# Patient Record
Sex: Female | Born: 2000
Health system: Southern US, Community
[De-identification: ages and names within clinical notes are randomized; demographics above are authoritative.]

## PROBLEM LIST (undated history)

## (undated) ENCOUNTER — Ambulatory Visit: Discharge status: Absent without leave | Payer: Self-pay

---
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
## 2020-01-01 ENCOUNTER — Other Ambulatory Visit: Payer: Self-pay

## 2020-01-01 ENCOUNTER — Ambulatory Visit (INDEPENDENT_AMBULATORY_CARE_PROVIDER_SITE_OTHER): Payer: BC Managed Care – PPO | Admitting: Nurse Practitioner

## 2020-01-01 ENCOUNTER — Encounter: Payer: Self-pay | Admitting: Nurse Practitioner

## 2020-01-01 VITALS — BP 118/72 | HR 78 | Ht 64.5 in | Wt 159.4 lb

## 2020-01-01 DIAGNOSIS — Z01419 Encounter for gynecological examination (general) (routine) without abnormal findings: Secondary | ICD-10-CM | POA: Diagnosis not present

## 2020-01-01 MED ORDER — DROSPIRENONE-ETHINYL ESTRADIOL 3-0.02 MG PO TABS: 1.0000 | ORAL_TABLET | Freq: Every day | ORAL | 0 refills | Status: DC

## 2020-01-01 NOTE — Progress Notes (Signed)
Subjective:    Patient ID: Laura Mccoy, female    DOB: 2000-07-01, 19 y.o.   MRN: [id number]  HPI  The patient comes in today for a wellness visit.    A review of their health history was completed.  A review of medications was also completed.  Any needed refills; birth control  Eating habits: not as good during the summer; better when she is back in school  Falls/  MVA accidents in past few months: none  Regular exercise: softball training  Specialist pt sees on regular basis: none  Preventative health issues were discussed.   Additional concerns: none  Menses regular, normal flow on oc's. Denies any history of sexual activity.  Regular vision and dental exams.   Review of Systems  Constitutional: Negative for activity change, appetite change, fatigue and fever.  Respiratory: Negative for cough, chest tightness, shortness of breath and wheezing.   Cardiovascular: Negative for chest pain.  Gastrointestinal: Negative for abdominal distention, abdominal pain, constipation, diarrhea, nausea and vomiting.  Genitourinary: Negative for difficulty urinating, dysuria, enuresis, frequency, genital sores, menstrual problem, pelvic pain, urgency and vaginal discharge.   Depression screen PHQ 2/9 01/01/2020  Decreased Interest 0  Down, Depressed, Hopeless 0  PHQ - 2 Score 0        Objective:   Physical Exam Constitutional:      General: She is not in acute distress.    Appearance: Normal appearance. She is well-developed.  Neck:     Thyroid: No thyromegaly.     Trachea: No tracheal deviation.  Cardiovascular:     Rate and Rhythm: Normal rate and regular rhythm.     Heart sounds: Normal heart sounds. No murmur heard.   Pulmonary:     Effort: Pulmonary effort is normal.     Breath sounds: Normal breath sounds.  Abdominal:     General: There is no distension.     Palpations: Abdomen is soft. There is no mass.     Tenderness: There is no abdominal tenderness.    Genitourinary:    Vagina: Normal.     Comments: Defers GU and breast exams. Denies any problems.  Musculoskeletal:        General: Normal range of motion.     Cervical back: Normal range of motion and neck supple.     Comments: Ortho exam normal. Scoliosis exam normal.   Lymphadenopathy:     Cervical: No cervical adenopathy.  Skin:    General: Skin is warm and dry.     Findings: No rash.  Neurological:     Mental Status: She is alert and oriented to person, place, and time.     Coordination: Coordination normal.     Gait: Gait normal.     Deep Tendon Reflexes: Reflexes normal.  Psychiatric:        Mood and Affect: Mood normal.        Behavior: Behavior normal.        Thought Content: Thought content normal.        Judgment: Judgment normal.    Today's Vitals   01/01/20 1325  BP: 118/72  Pulse: 78  Weight: 159 lb 6.4 oz (72.3 kg)  Height: 5' 4.5" (1.638 m)   Body mass index is 26.94 kg/m.        Assessment & Plan:  Well woman exam  Meds ordered this encounter  Medications  . drospirenone-ethinyl estradiol (YAZ) 3-0.02 MG tablet    Sig: Take 1 tablet by mouth daily.  Dispense:  30 tablet    Refill:  0    Order Specific Question:   Supervising Provider    Answer:   Lilyan Punt A [[id number]]   Continue oc's as directed.  Discussed safe sex issues. Encouraged healthy diet.  Return in about 1 year (around 12/31/2020) for physical.

## 2020-01-01 NOTE — Patient Instructions (Signed)
Tdap vaccine

## 2020-01-02 ENCOUNTER — Encounter: Payer: Self-pay | Admitting: Nurse Practitioner

## 2020-02-26 ENCOUNTER — Telehealth: Payer: Self-pay | Admitting: *Deleted

## 2020-02-26 ENCOUNTER — Other Ambulatory Visit: Payer: Self-pay

## 2020-02-26 ENCOUNTER — Telehealth (INDEPENDENT_AMBULATORY_CARE_PROVIDER_SITE_OTHER): Payer: BC Managed Care – PPO | Admitting: Family Medicine

## 2020-02-26 DIAGNOSIS — J019 Acute sinusitis, unspecified: Secondary | ICD-10-CM

## 2020-02-26 DIAGNOSIS — R059 Cough, unspecified: Secondary | ICD-10-CM

## 2020-02-26 MED ORDER — PREDNISONE 20 MG PO TABS: ORAL_TABLET | ORAL | 0 refills | Status: DC

## 2020-02-26 MED ORDER — AZITHROMYCIN 250 MG PO TABS: ORAL_TABLET | ORAL | 0 refills | Status: DC

## 2020-02-26 MED ORDER — BENZONATATE 100 MG PO CAPS
100.0000 mg | ORAL_CAPSULE | Freq: Three times a day (TID) | ORAL | 1 refills | Status: DC | PRN
Start: 2020-02-26 — End: 2020-05-19

## 2020-02-26 NOTE — Telephone Encounter (Signed)
Ms. aracelia, brinson are scheduled for a virtual visit with your provider today.    Just as we do with appointments in the office, we must obtain your consent to participate.  Your consent will be active for this visit and any virtual visit you may have with one of our providers in the next 365 days.    If you have a MyChart account, I can also send a copy of this consent to you electronically.  All virtual visits are billed to your insurance company just like a traditional visit in the office.  As this is a virtual visit, video technology does not allow for your provider to perform a traditional examination.  This may limit your provider's ability to fully assess your condition.  If your provider identifies any concerns that need to be evaluated in person or the need to arrange testing such as labs, EKG, etc, we will make arrangements to do so.    Although advances in technology are sophisticated, we cannot ensure that it will always work on either your end or our end.  If the connection with a video visit is poor, we may have to switch to a telephone visit.  With either a video or telephone visit, we are not always able to ensure that we have a secure connection.   I need to obtain your verbal consent now.   Are you willing to proceed with your visit today?   Zarina B Krall has provided verbal consent on 02/26/2020 for a virtual visit (video or telephone).   Kathleen Lime, RN 02/26/2020  8:25 AM

## 2020-02-26 NOTE — Progress Notes (Signed)
° °  Subjective:    Patient ID: Laura Mccoy, female    DOB: [birth year], 19 y.o.   MRN: [id number]  Cough This is a new problem. The current episode started in the past 7 days.  Patient has had 10 days of illness. Started off like allergies. Then moved into her chest. A lot of congestion and coughing. Patient has not had any wheezing. But does feel little bit short of breath when she goes up steps. She denies any major setbacks. No nausea vomiting or diarrhea. Energy level is subpar.  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   Virtual Visit via Video Note  I connected with Laura Mccoy on 02/26/20 at  8:20 AM EDT by a video enabled telemedicine application and verified that I am speaking with the correct person using two identifiers.  Location: Patient: home Provider: office   I discussed the limitations of evaluation and management by telemedicine and the availability of in person appointments. The patient expressed understanding and agreed to proceed.  History of Present Illness:    Observations/Objective:   Assessment and Plan:   Follow Up Instructions:    I discussed the assessment and treatment plan with the patient. The patient was provided an opportunity to ask questions and all were answered. The patient agreed with the plan and demonstrated an understanding of the instructions.   The patient was advised to call back or seek an in-person evaluation if the symptoms worsen or if the condition fails to improve as anticipated. Patient was seen in person    Objective:   Physical Exam Neck no masses lungs respiratory rate normal heart regular but frequent cough noted negative sinus tenderness         Assessment & Plan:  Patient tested frequently for Covid at her school so far negative We will collect Covid test today More than likely viral issue with secondary rhinosinusitis versus bronchitis Tessalon as needed Short course prednisone Z-Pak as directed Follow-up if progressive troubles

## 2020-02-28 LAB — NOVEL CORONAVIRUS, NAA: SARS-CoV-2, NAA: NOT DETECTED

## 2020-02-28 LAB — SARS-COV-2, NAA 2 DAY TAT

## 2020-02-28 LAB — SPECIMEN STATUS REPORT

## 2020-04-28 ENCOUNTER — Other Ambulatory Visit: Payer: Self-pay

## 2020-04-28 ENCOUNTER — Ambulatory Visit
Admission: RE | Admit: 2020-04-28 | Discharge: 2020-04-28 | Disposition: A | Discharge status: Home / Self Care | Payer: BC Managed Care – PPO | Source: Ambulatory Visit | Attending: Emergency Medicine | Admitting: Emergency Medicine

## 2020-04-28 VITALS — BP 133/85 | HR 75 | Temp 98.5°F | Resp 19

## 2020-04-28 DIAGNOSIS — N390 Urinary tract infection, site not specified: Secondary | ICD-10-CM | POA: Insufficient documentation

## 2020-04-28 DIAGNOSIS — R300 Dysuria: Secondary | ICD-10-CM | POA: Insufficient documentation

## 2020-04-28 LAB — POCT URINALYSIS DIP (MANUAL ENTRY)
Bilirubin, UA: NEGATIVE
Glucose, UA: NEGATIVE mg/dL
Ketones, POC UA: NEGATIVE mg/dL
Nitrite, UA: NEGATIVE
Protein Ur, POC: NEGATIVE mg/dL
Spec Grav, UA: 1.02 (ref 1.010–1.025)
Urobilinogen, UA: 0.2 E.U./dL
pH, UA: 7 (ref 5.0–8.0)

## 2020-04-28 LAB — POCT URINE PREGNANCY: Preg Test, Ur: NEGATIVE

## 2020-04-28 MED ORDER — NITROFURANTOIN MONOHYD MACRO 100 MG PO CAPS: 100.0000 mg | ORAL_CAPSULE | Freq: Two times a day (BID) | ORAL | 0 refills | Status: DC

## 2020-04-28 NOTE — ED Triage Notes (Signed)
Pt presents with complaints of dysuria x 1 week. Reports improvement in symptoms when drinking extra water. Symptoms have been persistent.

## 2020-04-28 NOTE — Discharge Instructions (Addendum)
Urine culture sent.  We will call you with the results.   Push fluids and get plenty of rest.   Take antibiotic as directed and to completion Follow up with PCP if symptoms persists Return here or go to ER if you have any new or worsening symptoms such as fever, worsening abdominal pain, nausea/vomiting, flank pain, etc... 

## 2020-04-28 NOTE — ED Provider Notes (Addendum)
[facility]   Chief Complaint  Patient presents with  . Appointment    2     SUBJECTIVE:  Laura Mccoy is a 19 y.o. female who presented to the urgent care for complaint of dysuria for the past 1 week.  Patient denies a precipitating event, recent sexual encounter, excessive caffeine intake.  Has tried OTC medications without relief.  Symptoms are made worse with urination.  Admits to similar symptoms in the past.  Denies fever, chills, nausea, vomiting, abdominal pain, flank pain, abnormal vaginal discharge or bleeding, hematuria.    LMP: No LMP recorded. (Menstrual status: Other).  ROS: As in HPI.  All other pertinent ROS negative.     No past medical history on file. No past surgical history on file. Allergies  Allergen Reactions  . Penicillins     REACTION: rash   No current facility-administered medications on file prior to encounter.   Current Outpatient Medications on File Prior to Encounter  Medication Sig Dispense Refill  . azithromycin (ZITHROMAX Z-PAK) 250 MG tablet Take 2 tablets (500 mg) on  Day 1,  followed by 1 tablet (250 mg) once daily on Days 2 through 5. 6 each 0  . benzonatate (TESSALON PERLES) 100 MG capsule Take 1 capsule (100 mg total) by mouth 3 (three) times daily as needed for cough. 30 capsule 1  . drospirenone-ethinyl estradiol (YAZ) 3-0.02 MG tablet Take 1 tablet by mouth daily. 30 tablet 0  . predniSONE (DELTASONE) 20 MG tablet 2qd for 5d 10 tablet 0   Social History   Socioeconomic History  . Marital status: Single    Spouse name: Not on file  . Number of children: Not on file  . Years of education: Not on file  . Highest education level: Not on file  Occupational History  . Not on file  Tobacco Use  . Smoking status: Never Smoker  . Smokeless tobacco: Never Used  Vaping Use  . Vaping Use: Never used  Substance and Sexual Activity  . Alcohol use: No  . Drug use: No  . Sexual activity: Never    Birth  control/protection: Pill  Other Topics Concern  . Not on file  Social History Narrative  . Not on file   Social Determinants of Health   Financial Resource Strain: Not on file  Food Insecurity: Not on file  Transportation Needs: Not on file  Physical Activity: Not on file  Stress: Not on file  Social Connections: Not on file  Intimate Partner Violence: Not on file   Family History  Problem Relation Age of Onset  . Heart disease Other   . Cancer Other   . Diabetes Other   . Healthy Mother   . Healthy Father     OBJECTIVE:  Vitals:   04/28/20 1453  BP: 133/85  Pulse: 75  Resp: 19  Temp: 98.5 F (36.9 C)  SpO2: 96%   General appearance: AOx3 in no acute distress HEENT: NCAT.  Oropharynx clear.  Lungs: clear to auscultation bilaterally without adventitious breath sounds Heart: regular rate and rhythm.  Radial pulses 2+ symmetrical bilaterally Abdomen: soft; non-distended; no tenderness; bowel sounds present; no guarding or rebound tenderness Back: no CVA tenderness Extremities: no edema; symmetrical with no gross deformities Skin: warm and dry Neurologic: Ambulates from chair to exam table without difficulty Psychological: alert and cooperative; normal mood and affect  Labs Reviewed  POCT URINALYSIS DIP (MANUAL ENTRY) - Abnormal; Notable for the following components:  Result Value   Blood, UA moderate (*)    Leukocytes, UA Moderate (2+) (*)    All other components within normal limits  URINE CULTURE  POCT URINE PREGNANCY    ASSESSMENT & PLAN:  1. Dysuria   2. Acute lower UTI     Meds ordered this encounter  Medications  . DISCONTD: nitrofurantoin, macrocrystal-monohydrate, (MACROBID) 100 MG capsule    Sig: Take 1 capsule (100 mg total) by mouth 2 (two) times daily.    Dispense:  10 capsule    Refill:  0  . nitrofurantoin, macrocrystal-monohydrate, (MACROBID) 100 MG capsule    Sig: Take 1 capsule (100 mg total) by mouth 2 (two) times daily.     Dispense:  10 capsule    Refill:  0    Discharge instructions  Urine culture sent.  We will call you with the results.   Push fluids and get plenty of rest.   Take antibiotic as directed and to completion Follow up with PCP if symptoms persists Return here or go to ER if you have any new or worsening symptoms such as fever, worsening abdominal pain, nausea/vomiting, flank pain, etc.   Outlined signs and symptoms indicating need for more acute intervention. Patient verbalized understanding. After Visit Summary given.     Durward Parcel, FNP 04/28/20 1506    Durward Parcel, FNP 04/28/20 1715

## 2020-04-30 LAB — URINE CULTURE: Culture: NO GROWTH

## 2020-05-19 ENCOUNTER — Ambulatory Visit: Payer: BC Managed Care – PPO | Admitting: Nurse Practitioner

## 2020-05-19 ENCOUNTER — Encounter: Payer: Self-pay | Admitting: Nurse Practitioner

## 2020-05-19 ENCOUNTER — Other Ambulatory Visit: Payer: Self-pay

## 2020-05-19 VITALS — BP 122/80 | HR 108 | Temp 97.8°F | Wt 161.4 lb

## 2020-05-19 DIAGNOSIS — Z23 Encounter for immunization: Secondary | ICD-10-CM

## 2020-05-19 DIAGNOSIS — Z7189 Other specified counseling: Secondary | ICD-10-CM | POA: Diagnosis not present

## 2020-05-19 DIAGNOSIS — Z7185 Encounter for immunization safety counseling: Secondary | ICD-10-CM

## 2020-05-19 NOTE — Progress Notes (Signed)
   Subjective:    Patient ID: Laura Mccoy, female    DOB: [birth year], 19 y.o.   MRN: [id number]  HPI Patient comes in for tdap booster as recommended by Eber Jones.  No injury, just needs routine update of Tdap. Patient is sexually active.  Defers STD testing.  Has not had her HPV vaccine. Wants to discuss Covid vaccine.  Review of Systems     Objective:   Physical Exam NAD.  Alert, oriented.  Lungs clear.  Heart regular rate rhythm. Today's Vitals   05/19/20 1028  BP: 122/80  Pulse: (!) 108  Temp: 97.8 F (36.6 C)  SpO2: 99%  Weight: 161 lb 6.4 oz (73.2 kg)   Body mass index is 27.28 kg/m.       Assessment & Plan:  HPV vaccine counseling  Need for vaccination - Plan: Td : Tetanus/diphtheria >7yo Preservative  free  Tdap administered today. Given written and verbal information on HPV vaccine. Defers STD testing at this time, denies any problems. Also discussion regarding Covid vaccine.  Patient plans to get this in the near future.

## 2020-05-19 NOTE — Patient Instructions (Signed)
HPV Vaccine Information for Parents  HPV (human papillomavirus) is a common virus that spreads from person to person through sexual contact. It can spread during vaginal, anal, or oral sex. There are many types of HPV viruses, and some may cause cancer. Your child can get a vaccination to prevent HPV infection and cancer. The vaccine is both safe and effective. It is recommended for boys and girls at about 53-19 years of age. Getting the vaccination at this age--before becoming sexually active--gives your child the best chance at protection from HPV infection through adulthood. How can HPV affect my child? HPV infection can cause:  Genital warts.  Mouth or throat cancer (oropharyngeal cancer).  Anal cancer.  Cervical, vulvar, or vaginal cancer.  Penile cancer. During pregnancy, HPV infection can be passed to the baby. This infection can cause warts to develop in a baby's throat and windpipe. What actions can I take to lower my child's risk for HPV? To lower your child's risk for HPV infection, have him or her get the HPV vaccination before becoming sexually active. The best time for vaccination is between ages 19 and 15, though it can be given to children as young as 19 years old. If your child gets the first dose before age 19, the vaccination can be given as 2 shots (doses), 6-12 months apart. In some situations, 3 doses are needed:  If your child starts the vaccine before age 19 but does not have a second dose within 6-12 months, your child will need 3 doses to complete the vaccination. When your child has the first dose, it is important to make an appointment for the next shot and keep the appointment.  Teens who are not vaccinated before age 19 will need 3 doses given within 6 months.  If your child has a weak immune system, he or she may need 3 doses. Young adults can also get the vaccination, even if they are already sexually active and even if they have already been infected with HPV.  The vaccination can still help prevent the types of cancer-causing HPV that a person has not been infected with. What are the risks and benefits of the HPV vaccine? Benefits The main benefit of getting vaccinated is to prevent certain cancers, including:  Cervical, vulvar, and vaginal cancer in females.  Penile cancer in males.  Oral and anal cancer in both males and females. The risk of these cancers is lower if your child gets vaccinated before he or she becomes sexually active. The vaccine also prevents genital warts caused by HPV. Risks The risks, although low, include side effects or reactions to the vaccine. Very few reactions have been reported, but they can include:  Soreness, redness, or swelling at the injection site.  Dizziness or headache.  Fever. Who should not get the HPV vaccine or should wait to get it? Some children should not get the HPV vaccine or should wait. Discuss the risks and benefits of the vaccine with your child's health care provider if your child:  Has had a severe allergic reaction to other vaccinations.  Is allergic to yeast.  Has a fever.  Has had a recent illness.  Is pregnant or may be pregnant. Where to find more information  Centers for Disease Control and Prevention: https://www.boyd-meyer.org/  American Academy of Pediatrics: healthychildren.org Summary  HPV (human papillomavirus) is a common virus that spreads from person to person through sexual contact. It can spread during vaginal, anal, or oral sex.  Your child can  get a vaccination to prevent HPV infection and cancer. It is best to get the vaccination before becoming sexually active.  The HPV vaccine can protect your child from genital warts and certain types of cancer, including cancer of the cervix, throat, mouth, vulva, vagina, anus, and penis.  The HPV vaccine is both safe and effective.  The best time for boys and girls to get the vaccination is when they are between ages 19 and  57. This information is not intended to replace advice given to you by your health care provider. Make sure you discuss any questions you have with your health care provider. Document Revised: 10/27/2018 Document Reviewed: 07/25/2017 Elsevier Patient Education  2020 [employer].

## 2020-05-20 ENCOUNTER — Encounter: Payer: Self-pay | Admitting: Nurse Practitioner

## 2020-11-08 DIAGNOSIS — B360 Pityriasis versicolor: Secondary | ICD-10-CM | POA: Diagnosis not present

## 2020-11-08 DIAGNOSIS — L308 Other specified dermatitis: Secondary | ICD-10-CM | POA: Diagnosis not present

## 2020-11-25 ENCOUNTER — Ambulatory Visit (INDEPENDENT_AMBULATORY_CARE_PROVIDER_SITE_OTHER): Payer: BC Managed Care – PPO | Admitting: Nurse Practitioner

## 2020-11-25 ENCOUNTER — Encounter: Payer: Self-pay | Admitting: Nurse Practitioner

## 2020-11-25 ENCOUNTER — Other Ambulatory Visit: Payer: Self-pay

## 2020-11-25 VITALS — BP 119/81 | HR 80 | Temp 98.1°F | Ht 64.5 in | Wt 152.0 lb

## 2020-11-25 DIAGNOSIS — Z0000 Encounter for general adult medical examination without abnormal findings: Secondary | ICD-10-CM

## 2020-11-25 DIAGNOSIS — F419 Anxiety disorder, unspecified: Secondary | ICD-10-CM

## 2020-11-25 DIAGNOSIS — Z0001 Encounter for general adult medical examination with abnormal findings: Secondary | ICD-10-CM

## 2020-11-25 NOTE — Patient Instructions (Signed)
HPV vaccine; [email or web address]

## 2020-11-25 NOTE — Progress Notes (Signed)
Subjective:    Patient ID: Laura Mccoy, female    DOB: 2001-01-12, 20 y.o.   MRN: [id number]  HPI The patient comes in today for a wellness visit.    A review of their health history was completed.  A review of medications was also completed.  Any needed refills; update refills on yaz  reqesting one year of refills so she dont run out at school.   Eating habits: heatlh conscious  Falls/  MVA accidents in past few months: none  Regular exercise: run, walk, cardio, weights, swim  Specialist pt sees on regular basis: none  Preventative health issues were discussed.   Same sexual partner for 3 years; defers STD testing.  Regular vision and dental exams.  Needs sports physical form completed today. Regular cycles, normal flow lasting about 5 days. No missed oc's.  Has been having some anxiety. Denies suicidal or homicidal thoughts or ideation.  Depression screen [facility] 2/9 11/25/2020 01/01/2020  Decreased Interest 0 0  Down, Depressed, Hopeless 0 0  PHQ - 2 Score 0 0   GAD 7 : Generalized Anxiety Score 11/25/2020  Nervous, Anxious, on Edge 0  Control/stop worrying 2  Worry too much - different things 2  Trouble relaxing 1  Restless 1  Easily annoyed or irritable 2  Afraid - awful might happen 2  Total GAD 7 Score 10  Anxiety Difficulty Not difficult at all      Review of Systems  Constitutional:  Negative for activity change, appetite change and fatigue.  HENT:  Negative for sore throat.   Respiratory:  Negative for cough, chest tightness, shortness of breath and wheezing.   Cardiovascular:  Negative for chest pain.  Gastrointestinal:  Negative for abdominal distention, abdominal pain, constipation, diarrhea, nausea and vomiting.  Genitourinary:  Negative for difficulty urinating, dyspareunia, dysuria, enuresis, frequency, genital sores, menstrual problem, pelvic pain, urgency and vaginal discharge.  Reviewed sports physical history form, no issues noted.     Objective:   Physical Exam Constitutional:      General: She is not in acute distress.    Appearance: She is well-developed.  HENT:     Head: Normocephalic.     Right Ear: Tympanic membrane normal.     Left Ear: Tympanic membrane normal.     Mouth/Throat:     Mouth: Mucous membranes are moist.     Pharynx: Oropharynx is clear.  Eyes:     Comments: Regular optometry exams.  Neck:     Thyroid: No thyromegaly.     Trachea: No tracheal deviation.     Comments: Thyroid non tender to palpation. No mass or goiter noted.  Cardiovascular:     Rate and Rhythm: Normal rate and regular rhythm.     Heart sounds: Normal heart sounds. No murmur heard. Pulmonary:     Effort: Pulmonary effort is normal.     Breath sounds: Normal breath sounds.  Abdominal:     General: There is no distension.     Palpations: Abdomen is soft. There is no mass.     Tenderness: There is no abdominal tenderness.  Genitourinary:    Vagina: Normal.     Comments: Defers GU and breast exams, denies any problems. Musculoskeletal:        General: Normal range of motion.     Cervical back: Normal range of motion and neck supple.     Comments: Normal orthopedic exam.  Scoliosis exam normal.  Lymphadenopathy:     Cervical: No cervical  adenopathy.  Skin:    General: Skin is warm and dry.     Findings: No rash.  Neurological:     Mental Status: She is alert and oriented to person, place, and time.     Coordination: Coordination normal.     Gait: Gait normal.     Deep Tendon Reflexes: Reflexes normal.  Psychiatric:        Mood and Affect: Mood normal.        Behavior: Behavior normal.        Thought Content: Thought content normal.        Judgment: Judgment normal.  Today's Vitals   11/25/20 0958  BP: 119/81  Pulse: 80  Temp: 98.1 F (36.7 C)  SpO2: 99%  Weight: 152 lb (68.9 kg)  Height: 5' 4.5" (1.638 m)   Body mass index is 25.69 kg/m.         Assessment & Plan:   Problem List Items Addressed  This Visit       Other   Anxiety   Other Visit Diagnoses     Routine general medical examination at a health care facility    -  Primary      Defers medication or counseling at this time.  Will contact office if she needs further assistance. Encouraged continued activity and healthy diet. Discussed safe sex issues.  Defers STD testing at this time. Return in about 1 year (around 11/25/2021) for physical.

## 2020-11-26 ENCOUNTER — Encounter: Payer: Self-pay | Admitting: Nurse Practitioner

## 2020-11-26 DIAGNOSIS — F419 Anxiety disorder, unspecified: Secondary | ICD-10-CM | POA: Insufficient documentation

## 2020-12-07 ENCOUNTER — Encounter: Payer: Self-pay | Admitting: Nurse Practitioner

## 2020-12-07 ENCOUNTER — Other Ambulatory Visit: Payer: Self-pay | Admitting: Nurse Practitioner

## 2020-12-07 ENCOUNTER — Telehealth: Payer: Self-pay | Admitting: Nurse Practitioner

## 2020-12-07 MED ORDER — DROSPIRENONE-ETHINYL ESTRADIOL 3-0.02 MG PO TABS: 1.0000 | ORAL_TABLET | Freq: Every day | ORAL | 11 refills | Status: DC

## 2020-12-07 NOTE — Telephone Encounter (Signed)
Pt also sent my chart message; message has been routed to provider.

## 2020-12-07 NOTE — Telephone Encounter (Signed)
Patient is needing refill on birthcontrol sent in. She was seen 7/8 for physical and they were suppose to be sent in. She is needing drospirenone-ethinyl estradiol 3.0 called into [facility] . [place] beach on [place]

## 2021-01-06 ENCOUNTER — Encounter: Payer: BC Managed Care – PPO | Admitting: Nurse Practitioner

## 2021-05-23 ENCOUNTER — Other Ambulatory Visit: Payer: Self-pay

## 2021-05-23 ENCOUNTER — Encounter: Payer: Self-pay | Admitting: Podiatry

## 2021-05-23 ENCOUNTER — Ambulatory Visit: Payer: BC Managed Care – PPO | Admitting: Podiatry

## 2021-05-23 DIAGNOSIS — L603 Nail dystrophy: Secondary | ICD-10-CM

## 2021-05-23 NOTE — Progress Notes (Signed)
°  Subjective:  Patient ID: Laura Mccoy, female    DOB: 07-26-2000,  MRN: [id number] HPI Chief Complaint  Patient presents with   Nail Problem    Hallux bilateral - lateral borders of nail cracked and fell off, the portion that fell off was discolored, active in softball   New Patient (Initial Visit)    21 y.o. female presents with the above complaint.   ROS: Denies fever chills nausea vomiting muscle aches pains calf pain back pain chest pain shortness of breath.  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  No past medical history on file. No past surgical history on file.  Current Outpatient Medications:    drospirenone-ethinyl estradiol (YAZ) 3-0.02 MG tablet, Take 1 tablet by mouth daily., Disp: 28 tablet, Rfl: 11  Allergies  Allergen Reactions   Penicillins     REACTION: rash   Review of Systems Objective:  There were no vitals filed for this visit.  General: Well developed, nourished, in no acute distress, alert and oriented x3   Dermatological: Skin is warm, dry and supple bilateral. Nails x 10 are well maintained; remaining integument appears unremarkable at this time. There are no open sores, no preulcerative lesions, no rash or signs of infection present.  Hallux nails bilaterally demonstrate lateral corners of each hallux nail has some basically delaminated and split from the lower level of the digit.  There is nail on the bed but basically the delamination appears to be chronic.  There is no signs of infection no signs of fungus on the skin or any of the other nails.  Vascular: Dorsalis Pedis artery and Posterior Tibial artery pedal pulses are 2/4 bilateral with immedate capillary fill time. Pedal hair growth present. No varicosities and no lower extremity edema present bilateral.   Neruologic: Grossly intact via light touch bilateral. Vibratory intact via tuning fork bilateral. Protective  threshold with Semmes Wienstein monofilament intact to all pedal sites bilateral. Patellar and Achilles deep tendon reflexes 2+ bilateral. No Babinski or clonus noted bilateral.   Musculoskeletal: No gross boney pedal deformities bilateral. No pain, crepitus, or limitation noted with foot and ankle range of motion bilateral. Muscular strength 5/5 in all groups tested bilateral.  Gait: Unassisted, Nonantalgic.    Radiographs:  None taken  Assessment & Plan:   Assessment: Nail dystrophy hallux bilateral most likely secondary to nail trauma and sports activities as well as nail weakness.  Plan: Discussed with her today to start biotin.  I also recommended that she try to keep her cleats adjusted in such a manner that they do not rub her toes.     Dyan Labarbera T. [place], [place]

## 2021-09-01 ENCOUNTER — Encounter: Payer: Self-pay | Admitting: Nurse Practitioner

## 2021-09-01 MED ORDER — FLUCONAZOLE 150 MG PO TABS: ORAL_TABLET | ORAL | 0 refills | Status: DC

## 2021-09-29 ENCOUNTER — Encounter: Payer: Self-pay | Admitting: Nurse Practitioner

## 2021-09-29 ENCOUNTER — Ambulatory Visit: Payer: BC Managed Care – PPO | Admitting: Nurse Practitioner

## 2021-09-29 VITALS — BP 134/87 | Ht 64.5 in | Wt 158.6 lb

## 2021-09-29 DIAGNOSIS — Z3041 Encounter for surveillance of contraceptive pills: Secondary | ICD-10-CM

## 2021-09-29 MED ORDER — DROSPIRENONE-ETHINYL ESTRADIOL 3-0.02 MG PO TABS: 1.0000 | ORAL_TABLET | Freq: Every day | ORAL | 3 refills | Status: DC

## 2021-09-29 NOTE — Progress Notes (Signed)
? ?  Subjective:  ? ? Patient ID: Laura Mccoy, female    DOB: September 02, 2000, 21 y.o.   MRN: [id number] ? ?HPI ? ?Patient arrives for a follow up on birth control. Patient states she needs refills on birth control. ?Rarely misses a pill.  Same sexual partner.  Defers STD testing.  Cycles regular with normal flow.  Non-smoker.  No vaping.  Will be moving to [facility] with her boyfriend in the near future.   ?Activity is limited at this time due to a injury to her right ankle while playing softball.  Wearing a large brace today. ? ? ? ?   ?Objective:  ? Physical Exam ?NAD.  Alert, oriented.  Lungs clear.  Heart regular rate rhythm. ? ?Today's Vitals  ? 09/29/21 0935  ?BP: 134/87  ?Weight: 158 lb 9.6 oz (71.9 kg)  ?Height: 5' 4.5" (1.638 m)  ? ?Body mass index is 26.8 kg/m?. ? ? ? ? ?   ?Assessment & Plan:  ? ?Problem List Items Addressed This Visit   ?None ?Visit Diagnoses   ? ? Encounter for repeat prescription of oral contraceptives    -  Primary  ? ?  ? ?Meds ordered this encounter  ?Medications  ? drospirenone-ethinyl estradiol (YAZ) 3-0.02 MG tablet  ?  Sig: Take 1 tablet by mouth daily.  ?  Dispense:  84 tablet  ?  Refill:  3  ?  Order Specific Question:   Supervising Provider  ?  Answer:   Lilyan Punt A [[id number]]  ? ?Continue birth control pills as directed.  Encourage patient to take it about the same time every day.  Recommend a preventive health physical including her first Pap smear sometime this year at our office or in [place]. ? ? ?

## 2021-10-05 ENCOUNTER — Encounter: Payer: Self-pay | Admitting: Nurse Practitioner

## 2021-10-06 ENCOUNTER — Encounter: Payer: Self-pay | Admitting: Nurse Practitioner

## 2021-10-07 ENCOUNTER — Other Ambulatory Visit: Payer: Self-pay | Admitting: Nurse Practitioner

## 2021-10-10 ENCOUNTER — Encounter: Payer: Self-pay | Admitting: Nurse Practitioner

## 2021-10-11 ENCOUNTER — Other Ambulatory Visit: Payer: Self-pay | Admitting: Nurse Practitioner

## 2021-10-12 ENCOUNTER — Other Ambulatory Visit: Payer: Self-pay | Admitting: Nurse Practitioner

## 2021-12-29 ENCOUNTER — Encounter: Payer: Self-pay | Admitting: Nurse Practitioner

## 2021-12-30 ENCOUNTER — Other Ambulatory Visit: Payer: Self-pay | Admitting: Nurse Practitioner

## 2021-12-30 MED ORDER — DROSPIRENONE-ETHINYL ESTRADIOL 3-0.02 MG PO TABS: 1.0000 | ORAL_TABLET | Freq: Every day | ORAL | 3 refills | Status: DC

## 2022-03-30 ENCOUNTER — Encounter: Payer: Self-pay | Admitting: Nurse Practitioner

## 2022-03-30 ENCOUNTER — Telehealth: Payer: Self-pay

## 2022-03-30 NOTE — Telephone Encounter (Signed)
Pt said she just got back off the phone with her insurance she needs a 90 days supply called in to the [facility] on  [address] [place]

## 2022-03-30 NOTE — Telephone Encounter (Signed)
Encourage patient to contact the pharmacy for refills or they can request refills through [facility]  (Please schedule appointment if patient has not been seen in over a year)    WHAT PHARMACY WOULD THEY LIKE THIS SENT TO: [facility] [address] [place] [address] send one RX then after this send 3 month supply   MEDICATION NAME & DOSE:rospirenone-ethinyl estradiol (YAZ) 3-0.02 MG tablet   NOTES/COMMENTS FROM PATIENT:Pt says need three month supply by mail order but pt does not have birth control and needs one pack sent to pharmacy. She is wanting Eber Jones to call the number she sent in her message so she can get set up on mail order   PT call back [phone]      Front office please notify patient: It takes 48-72 hours to process rx refill requests Ask patient to call pharmacy to ensure rx is ready before heading there.

## 2022-03-30 NOTE — Telephone Encounter (Signed)
Please advise. Thank you

## 2022-03-30 NOTE — Telephone Encounter (Signed)
My chart message also sent by patient.

## 2022-03-31 ENCOUNTER — Other Ambulatory Visit: Payer: Self-pay | Admitting: Nurse Practitioner

## 2022-03-31 MED ORDER — DROSPIRENONE-ETHINYL ESTRADIOL 3-0.02 MG PO TABS: 1.0000 | ORAL_TABLET | Freq: Every day | ORAL | 3 refills | Status: DC

## 2022-03-31 NOTE — Telephone Encounter (Signed)
Done

## 2022-06-25 ENCOUNTER — Telehealth: Payer: BC Managed Care – PPO | Admitting: Physician Assistant

## 2022-06-25 DIAGNOSIS — J039 Acute tonsillitis, unspecified: Secondary | ICD-10-CM

## 2022-06-25 MED ORDER — DOXYCYCLINE HYCLATE 100 MG PO TABS: 100.0000 mg | ORAL_TABLET | Freq: Two times a day (BID) | ORAL | 0 refills | Status: AC

## 2022-06-25 NOTE — Progress Notes (Signed)
Virtual Visit Consent   Laura Mccoy, you are scheduled for a virtual visit with a [facility] provider today. Just as with appointments in the office, your consent must be obtained to participate. Your consent will be active for this visit and any virtual visit you may have with one of our providers in the next 365 days. If you have a MyChart account, a copy of this consent can be sent to you electronically.  As this is a virtual visit, video technology does not allow for your provider to perform a traditional examination. This may limit your provider's ability to fully assess your condition. If your provider identifies any concerns that need to be evaluated in person or the need to arrange testing (such as labs, EKG, etc.), we will make arrangements to do so. Although advances in technology are sophisticated, we cannot ensure that it will always work on either your end or our end. If the connection with a video visit is poor, the visit may have to be switched to a telephone visit. With either a video or telephone visit, we are not always able to ensure that we have a secure connection.  By engaging in this virtual visit, you consent to the provision of healthcare and authorize for your insurance to be billed (if applicable) for the services provided during this visit. Depending on your insurance coverage, you may receive a charge related to this service.  I need to obtain your verbal consent now. Are you willing to proceed with your visit today? Laura Mccoy has provided verbal consent on 06/25/2022 for a virtual visit (video or telephone). Mar Daring, PA-C  Date: 06/25/2022 12:11 PM  Virtual Visit via Video Note   IMar Daring, connected with  Laura Mccoy  ([id number], 2001-01-25) on 06/25/22 at 12:00 PM EST by a video-enabled telemedicine application and verified that I am speaking with the correct person using two identifiers.  Location: Patient: Virtual Visit  Location Patient: Mobile Provider: Virtual Visit Location Provider: Home Office   I discussed the limitations of evaluation and management by telemedicine and the availability of in person appointments. The patient expressed understanding and agreed to proceed.    History of Present Illness: Laura Mccoy is a 22 y.o. who identifies as a female who was assigned female at birth, and is being seen today for flu-like symptoms.  HPI: URI  This is a new problem. The current episode started in the past 7 days (Started suddnely Saturday night). The problem has been gradually worsening. There has been no fever. Associated symptoms include congestion, coughing, ear pain, headaches, rhinorrhea, a sore throat and swollen glands. Pertinent negatives include no diarrhea, nausea, plugged ear sensation, sinus pain or vomiting. Associated symptoms comments: Chills and sweats, myalgias. She has tried sleep and increased fluids (mucinex dm) for the symptoms. The treatment provided no relief.    Did have sinus symptoms a couple weeks ago and then felt better then had this start on Saturday.  Problems:  Patient Active Problem List   Diagnosis Date Noted   Anxiety 11/26/2020   Fracture of 5th metatarsal 09/05/2011   Left foot pain 09/05/2011   FRACTURE, MEDIAL MALLEOLUS 04/26/2008    Allergies:  Allergies  Allergen Reactions   Penicillins     REACTION: rash   Medications:  Current Outpatient Medications:    doxycycline (VIBRA-TABS) 100 MG tablet, Take 1 tablet (100 mg total) by mouth 2 (two) times daily., Disp: 20 tablet, Rfl: 0  drospirenone-ethinyl estradiol (YAZ) 3-0.02 MG tablet, Take 1 tablet by mouth daily., Disp: 84 tablet, Rfl: 3  Observations/Objective: Patient is well-developed, well-nourished in no acute distress.  Resting comfortably Head is normocephalic, atraumatic.  No labored breathing.  Speech is clear and coherent with logical content.  Patient is alert and oriented at  baseline.    Assessment and Plan: 1. Tonsillitis - doxycycline (VIBRA-TABS) 100 MG tablet; Take 1 tablet (100 mg total) by mouth 2 (two) times daily.  Dispense: 20 tablet; Refill: 0  - Suspect tonsillitis - Doxycycline prescribed - Tylenol and Ibuprofen alternating every 4 hours - Salt water gargles - Chloraseptic spray - Liquid and soft food diet - Push fluids - New toothbrush in 3 days - Seek in person evaluation if not improving or if symptoms worsen   Follow Up Instructions: I discussed the assessment and treatment plan with the patient. The patient was provided an opportunity to ask questions and all were answered. The patient agreed with the plan and demonstrated an understanding of the instructions.  A copy of instructions were sent to the patient via MyChart unless otherwise noted below.    The patient was advised to call back or seek an in-person evaluation if the symptoms worsen or if the condition fails to improve as anticipated.  Time:  I spent 13 minutes with the patient via telehealth technology discussing the above problems/concerns.    Mar Daring, PA-C

## 2022-06-25 NOTE — Patient Instructions (Signed)
Darrol Poke, thank you for joining Mar Daring, PA-C for today's virtual visit.  While this provider is not your primary care provider (PCP), if your PCP is located in our provider database this encounter information will be shared with them immediately following your visit.   [facility] account gives you access to today's visit and all your visits, tests, and labs performed at [facility] " click here if you don't have a [facility] account or go to [email or web address]  Consent: (Patient) Laura Mccoy provided verbal consent for this virtual visit at the beginning of the encounter.  Current Medications:  Current Outpatient Medications:    doxycycline (VIBRA-TABS) 100 MG tablet, Take 1 tablet (100 mg total) by mouth 2 (two) times daily., Disp: 20 tablet, Rfl: 0   drospirenone-ethinyl estradiol (YAZ) 3-0.02 MG tablet, Take 1 tablet by mouth daily., Disp: 84 tablet, Rfl: 3   Medications ordered in this encounter:  Meds ordered this encounter  Medications   doxycycline (VIBRA-TABS) 100 MG tablet    Sig: Take 1 tablet (100 mg total) by mouth 2 (two) times daily.    Dispense:  20 tablet    Refill:  0    Order Specific Question:   Supervising Provider    Answer:   Chase Picket [id number]     *If you need refills on other medications prior to your next appointment, please contact your pharmacy*  Follow-Up: Call back or seek an in-person evaluation if the symptoms worsen or if the condition fails to improve as anticipated.  [facility] [phone]  Other Instructions  Tonsillitis  Tonsillitis is an infection of the throat that causes the tonsils to become red, tender, and swollen. Tonsils are tissues in the back of your throat. Each tonsil has crevices (crypts). Tonsils normally work to protect the body from infection. What are the causes? Sudden (acute) tonsillitis may be caused by a virus or bacteria,  including streptococcal bacteria. Long-lasting (chronic) tonsillitis occurs when the crypts of the tonsils become filled with pieces of food and bacteria, which makes it easy for the tonsils to become repeatedly infected. Tonsillitis can be spread from person to person when it is caused by a virus or bacteria. It may be spread by inhaling droplets that are released with coughing or sneezing. You may also come into contact with viruses or bacteria on surfaces, such as cups or utensils. What are the signs or symptoms? Symptoms of this condition include: A sore throat. This may include trouble swallowing. White patches on the tonsils. Swollen tonsils. Fever. Headache. Tiredness. Loss of appetite. Snoring during sleep when you did not snore before. Small, foul-smelling, yellowish-white pieces of material (tonsilloliths) that you occasionally cough up or spit out. These can cause you to have bad breath. How is this diagnosed? This condition is diagnosed with a physical exam. Diagnosis can be confirmed with the results of lab tests, including a throat culture. How is this treated? Treatment for this condition depends on the cause, but usually focuses on treating the symptoms associated with it. Treatment may include: Medicines to relieve pain and manage fever. Steroid medicines to reduce swelling. Antibiotic medicines if the condition is caused by bacteria. If episodes of tonsillitis are severe and frequent, your health care provider may recommend surgery to remove the tonsils (tonsillectomy). Follow these instructions at home: Medicines Take over-the-counter and prescription medicines only as told by your health care provider. If you were prescribed an antibiotic medicine,  take it as told by your health care provider. Do not stop taking the antibiotic even if you start to feel better. Eating and drinking Drink enough fluid to keep your urine pale yellow. While your throat is sore, eat soft or  liquid foods, such as sherbet, soups, or soft, warm cereals, such as oatmeal or hot wheat cereal. Drink warm liquids. Eat frozen ice pops. General instructions Rest as much as possible and get plenty of sleep. Gargle with a mixture of salt and water 3-4 times a day or as needed. To make salt water, completely dissolve -1 tsp (3-6 g) of salt in 1 cup (237 mL) of warm water. Do not swallow the mixture of salt and water. Wash your hands regularly with soap and water for at least 20 seconds. If soap and water are not available, use hand sanitizer. Do not share cups, bottles, or other utensils until your symptoms have gone away. Do not use any products that contain nicotine or tobacco. These products include cigarettes, chewing tobacco, and vaping devices, such as e-cigarettes. If you need help quitting, ask your health care provider. Keep all follow-up visits. This is important. Contact a health care provider if: You notice large, tender lumps in your neck that were not there before. You have a fever that does not go away after 2-3 days. You develop a rash. You cough up a green, yellow-brown, or bloody substance. You cannot swallow liquids or food for 24 hours. Only one of your tonsils is swollen. Get help right away if: You develop any new symptoms, such as vomiting, severe headache, stiff neck, chest pain, trouble breathing, or trouble swallowing. You have severe throat pain along with drooling or voice changes. You have severe pain that is not controlled with medicines. You cannot fully open your mouth. You develop redness, swelling, or severe pain anywhere in your neck. Summary Tonsillitis is an infection of the throat that causes the tonsils to become red, tender, and swollen. The most common symptom is pain in the throat. Tonsillitis is most often caused by a virus or bacteria. Get help right away if you develop any new symptoms, such as vomiting, severe headache, stiff neck, chest  pain, or trouble breathing. This information is not intended to replace advice given to you by your health care provider. Make sure you discuss any questions you have with your health care provider. Document Revised: 09/29/2020 Document Reviewed: 09/29/2020 Elsevier Patient Education  [place].    If you have been instructed to have an in-person evaluation today at a local Urgent Care facility, please use the link below. It will take you to a list of all of our available [facility], including address, phone number and hours of operation. Please do not delay care.  [facility]  If you or a family member do not have a primary care provider, use the link below to schedule a visit and establish care. When you choose a [facility] primary care physician or advanced practice provider, you gain a long-term partner in health. Find a Primary Care Provider  Learn more about [facility]'s in-office and virtual care options: [facility]

## 2023-02-27 ENCOUNTER — Encounter: Payer: Self-pay | Admitting: Family Medicine

## 2023-02-27 ENCOUNTER — Other Ambulatory Visit: Payer: Self-pay | Admitting: Nurse Practitioner

## 2023-02-27 MED ORDER — FLUCONAZOLE 150 MG PO TABS: ORAL_TABLET | ORAL | 0 refills | Status: DC

## 2023-03-01 ENCOUNTER — Other Ambulatory Visit: Payer: Self-pay | Admitting: Nurse Practitioner

## 2023-03-01 MED ORDER — DROSPIRENONE-ETHINYL ESTRADIOL 3-0.02 MG PO TABS: 1.0000 | ORAL_TABLET | Freq: Every day | ORAL | 3 refills | Status: AC

## 2023-03-01 MED ORDER — DROSPIRENONE-ETHINYL ESTRADIOL 3-0.02 MG PO TABS: 1.0000 | ORAL_TABLET | Freq: Every day | ORAL | 3 refills | Status: DC

## 2023-06-06 ENCOUNTER — Other Ambulatory Visit: Payer: Self-pay | Admitting: Nurse Practitioner

## 2023-06-06 MED ORDER — FLUCONAZOLE 150 MG PO TABS: ORAL_TABLET | ORAL | 0 refills | Status: DC

## 2023-11-06 ENCOUNTER — Encounter: Payer: Self-pay | Admitting: Family Medicine

## 2023-11-06 ENCOUNTER — Ambulatory Visit: Payer: Self-pay

## 2023-11-06 MED ORDER — FLUCONAZOLE 150 MG PO TABS: 150.0000 mg | ORAL_TABLET | Freq: Once | ORAL | 0 refills | Status: AC

## 2023-11-06 NOTE — Telephone Encounter (Signed)
 FYI Only or Action Required?: Action required by provider  Patient was last seen in primary care on 09/29/2021 by Derenda Flax, NP. Called Nurse Triage reporting Vaginal Itching. Symptoms began 2 weeks ago. Interventions attempted: AZO Symptoms are: gradually improving.  Triage Disposition: See PCP When Office is Open (Within 3 Days)  Patient/caregiver understands and will follow disposition?: No, wishes to speak with PCP   Pt is in [place] - instructed she needed to visit UC as she is having frequent yeast infections.      Reason for Triage: Spoke with mother who states patient has been trying to get in contact for the last week with no luck, do not see anything in chart besides a my chart message today. Patient has a yeast infection that she cannot completely get rid of with OTC medication, wanting to know if she can have diflucan  sent in to pharmacy in [facility]. Advised I am unsure they would be able to send meds there but would get message over and have someone reach out.   Reason for Disposition  [1] Symptoms of a yeast infection (i.e., itchy, white discharge, not bad smelling) AND [2] not improved > 3 days following Care Advice  Answer Assessment - Initial Assessment Questions 1. SYMPTOM: What's the main symptom you're concerned about? (e.g., pain, itching, dryness)     itching 2. LOCATION: Where is the  itching located? (e.g., inside/outside, left/right)     both 3. ONSET: When did the  itching  start?     2 weeks 4. PAIN: Is there any pain? If Yes, ask: How bad is it? (Scale: 1-10; mild, moderate, severe)   -  MILD (1-3): Doesn't interfere with normal activities.    -  MODERATE (4-7): Interferes with normal activities (e.g., work or school) or awakens from sleep.     -  SEVERE (8-10): Excruciating pain, unable to do any normal activities.     no 5. ITCHING: Is there any itching? If Yes, ask: How bad is it? (Scale: 1-10; mild, moderate, severe)     2/10 6.  CAUSE: What do you think is causing the discharge? Have you had the same problem before? What happened then?     No discharge 7. OTHER SYMPTOMS: Do you have any other symptoms? (e.g., fever, itching, vaginal bleeding, pain with urination, injury to genital area, vaginal foreign body)     no 8. PREGNANCY: Is there any chance you are pregnant? When was your last menstrual period?     10/18/23  Protocols used: Vaginal Symptoms-A-AH

## 2023-11-07 ENCOUNTER — Other Ambulatory Visit: Payer: Self-pay | Admitting: Nurse Practitioner

## 2023-11-07 ENCOUNTER — Encounter: Payer: Self-pay | Admitting: Nurse Practitioner

## 2023-11-07 MED ORDER — FLUCONAZOLE 150 MG PO TABS: ORAL_TABLET | ORAL | 0 refills | Status: AC

## 2023-12-27 DIAGNOSIS — Z01419 Encounter for gynecological examination (general) (routine) without abnormal findings: Secondary | ICD-10-CM | POA: Diagnosis not present

## 2023-12-27 DIAGNOSIS — Z6828 Body mass index (BMI) 28.0-28.9, adult: Secondary | ICD-10-CM | POA: Diagnosis not present

## 2023-12-27 DIAGNOSIS — Z124 Encounter for screening for malignant neoplasm of cervix: Secondary | ICD-10-CM | POA: Diagnosis not present

## 2024-01-31 ENCOUNTER — Telehealth: Payer: Self-pay | Admitting: Nurse Practitioner

## 2024-01-31 NOTE — Telephone Encounter (Signed)
 Patient is requesting refill on  drospirenone -ethinyl estradiol  (YAZ) 3-0.02 MG tablet  to be sent [facility] [place] supraclavicular She hasn't been seen here since 2023

## 2024-02-01 ENCOUNTER — Other Ambulatory Visit: Payer: Self-pay | Admitting: Nurse Practitioner

## 2024-02-04 NOTE — Telephone Encounter (Signed)
 Pharmacy notified and will check with patient to send to current provider
